# Patient Record
Sex: Male | Born: 1965 | Race: Black or African American | Hispanic: No | Smoking: Never smoker
Health system: Southern US, Community
[De-identification: ages and names within clinical notes are randomized; demographics above are authoritative.]

## PROBLEM LIST (undated history)

## (undated) DIAGNOSIS — I10 Essential (primary) hypertension: Secondary | ICD-10-CM

---

## 2006-06-30 ENCOUNTER — Emergency Department (HOSPITAL_COMMUNITY): Admission: EM | Admit: 2006-06-30 | Discharge: 2006-06-30 | Payer: Self-pay | Admitting: Emergency Medicine

## 2010-03-18 ENCOUNTER — Emergency Department (HOSPITAL_COMMUNITY): Admission: EM | Admit: 2010-03-18 | Discharge: 2010-03-18 | Payer: Self-pay | Admitting: Emergency Medicine

## 2010-05-13 ENCOUNTER — Emergency Department (HOSPITAL_COMMUNITY)
Admission: EM | Admit: 2010-05-13 | Discharge: 2010-05-13 | Payer: Self-pay | Source: Home / Self Care | Admitting: Emergency Medicine

## 2014-08-14 ENCOUNTER — Emergency Department (HOSPITAL_COMMUNITY)
Admission: EM | Admit: 2014-08-14 | Discharge: 2014-08-14 | Disposition: A | Discharge status: Home / Self Care | Payer: No Typology Code available for payment source | Attending: Emergency Medicine | Admitting: Emergency Medicine

## 2014-08-14 ENCOUNTER — Encounter (HOSPITAL_COMMUNITY): Payer: Self-pay | Admitting: Emergency Medicine

## 2014-08-14 ENCOUNTER — Emergency Department (HOSPITAL_COMMUNITY): Payer: No Typology Code available for payment source

## 2014-08-14 DIAGNOSIS — S199XXA Unspecified injury of neck, initial encounter: Secondary | ICD-10-CM | POA: Diagnosis present

## 2014-08-14 DIAGNOSIS — Y939 Activity, unspecified: Secondary | ICD-10-CM | POA: Insufficient documentation

## 2014-08-14 DIAGNOSIS — Y92410 Unspecified street and highway as the place of occurrence of the external cause: Secondary | ICD-10-CM | POA: Diagnosis not present

## 2014-08-14 DIAGNOSIS — S161XXA Strain of muscle, fascia and tendon at neck level, initial encounter: Secondary | ICD-10-CM

## 2014-08-14 DIAGNOSIS — S3992XA Unspecified injury of lower back, initial encounter: Secondary | ICD-10-CM | POA: Diagnosis not present

## 2014-08-14 DIAGNOSIS — Y998 Other external cause status: Secondary | ICD-10-CM | POA: Diagnosis not present

## 2014-08-14 DIAGNOSIS — M791 Myalgia, unspecified site: Secondary | ICD-10-CM

## 2014-08-14 DIAGNOSIS — M545 Low back pain, unspecified: Secondary | ICD-10-CM

## 2014-08-14 MED ORDER — CYCLOBENZAPRINE HCL 10 MG PO TABS: 5.0000 mg | ORAL_TABLET | Freq: Two times a day (BID) | ORAL | Status: DC | PRN
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
## 2014-08-14 NOTE — Discharge Instructions (Signed)
Please call your doctor for a followup appointment within 24-48 hours. When you talk to your doctor please let them know that you were seen in the emergency department and have them acquire all of your records so that they can discuss the findings with you and formulate a treatment plan to fully care for your new and ongoing problems. Please follow-up with health and wellness Center Please follow-up with orthopedics Please rest and stay hydrated Please apply warm compressions and massage Please follow-up with health and wellness center-please have blood pressure rechecked within the next 48 hours While on Flexeril, which is a muscle relaxer, please no drink alcohol, drive, operate any heavy machinery for this can cause drowsiness. Please continue to monitor symptoms closely and if symptoms are to worsen or change (fever greater than 101, chills, sweating, nausea, vomiting, chest pain, shortness of breathe, difficulty breathing, weakness, numbness, tingling, worsening or changes to pain pattern, fall, injury, dizziness, loss of sensation, inability to control urine or bowel movements) please report back to the Emergency Department immediately.   Muscle Pain Muscle pain (myalgia) may be caused by many things, including:  Overuse or muscle strain, especially if you are not in shape. This is the most common cause of muscle pain.  Injury.  Bruises.  Viruses, such as the flu.  Infectious diseases.  Fibromyalgia, which is a chronic condition that causes muscle tenderness, fatigue, and headache.  Autoimmune diseases, including lupus.  Certain drugs, including ACE inhibitors and statins. Muscle pain may be mild or severe. In most cases, the pain lasts only a short time and goes away without treatment. To diagnose the cause of your muscle pain, your health care provider will take your medical history. This means he or she will ask you when your muscle pain began and what has been happening. If you  have not had muscle pain for very long, your health care provider may want to wait before doing much testing. If your muscle pain has lasted a long time, your health care provider may want to run tests right away. If your health care provider thinks your muscle pain may be caused by illness, you may need to have additional tests to rule out certain conditions.  Treatment for muscle pain depends on the cause. Home care is often enough to relieve muscle pain. Your health care provider may also prescribe anti-inflammatory medicine. HOME CARE INSTRUCTIONS Watch your condition for any changes. The following actions may help to lessen any discomfort you are feeling:  Only take over-the-counter or prescription medicines as directed by your health care provider.  Apply ice to the sore muscle:  Put ice in a plastic bag.  Place a towel between your skin and the bag.  Leave the ice on for 15-20 minutes, 3-4 times a day.  You may alternate applying hot and cold packs to the muscle as directed by your health care provider.  If overuse is causing your muscle pain, slow down your activities until the pain goes away.  Remember that it is normal to feel some muscle pain after starting a workout program. Muscles that have not been used often will be sore at first.  Do regular, gentle exercises if you are not usually active.  Warm up before exercising to lower your risk of muscle pain.  Do not continue working out if the pain is very bad. Bad pain could mean you have injured a muscle. SEEK MEDICAL CARE IF:  Your muscle pain gets worse, and medicines do not  help.  You have muscle pain that lasts longer than 3 days.  You have a rash or fever along with muscle pain.  You have muscle pain after a tick bite.  You have muscle pain while working out, even though you are in good physical condition.  You have redness, soreness, or swelling along with muscle pain.  You have muscle pain after starting a  new medicine or changing the dose of a medicine. SEEK IMMEDIATE MEDICAL CARE IF:  You have trouble breathing.  You have trouble swallowing.  You have muscle pain along with a stiff neck, fever, and vomiting.  You have severe muscle weakness or cannot move part of your body. MAKE SURE YOU:   Understand these instructions.  Will watch your condition.  Will get help right away if you are not doing well or get worse. Document Released: 04/24/2006 Document Revised: 06/07/2013 Document Reviewed: 03/29/2013 [facility] Patient Information 2015 [place], [place]. This information is not intended to replace advice given to you by your health care provider. Make sure you discuss any questions you have with your health care provider.   Emergency Department Resource Guide 1) Find a Doctor and Pay Out of Pocket Although you won't have to find out who is covered by your insurance plan, it is a good idea to ask around and get recommendations. You will then need to call the office and see if the doctor you have chosen will accept you as a new patient and what types of options they offer for patients who are self-pay. Some doctors offer discounts or will set up payment plans for their patients who do not have insurance, but you will need to ask so you aren't surprised when you get to your appointment.  2) Contact Your Local Health Department Not all health departments have doctors that can see patients for sick visits, but many do, so it is worth a call to see if yours does. If you don't know where your local health department is, you can check in your phone book. The CDC also has a tool to help you locate your state's health department, and many state websites also have listings of all of their local health departments.  3) Find a Walk-in Clinic If your illness is not likely to be very severe or complicated, you may want to try a walk in clinic. These are popping up all over the country in pharmacies,  drugstores, and shopping centers. They're usually staffed by nurse practitioners or physician assistants that have been trained to treat common illnesses and complaints. They're usually fairly quick and inexpensive. However, if you have serious medical issues or chronic medical problems, these are probably not your best option.  No Primary Care Doctor: - Call Health Connect at  [phone] - they can help you locate a primary care doctor that  accepts your insurance, provides certain services, etc. - Physician Referral Service- [phone]  Chronic Pain Problems: Organization         Address  Phone   Notes  [facility]  [phone] Patients need to be referred by their primary care doctor.   Medication Assistance: Organization         Address  Phone   Notes  [facility] [address] [place] [place], [place] [address] [phone] --Must be a resident of [facility] -- Must have NO insurance coverage whatsoever (no Medicaid/ Medicare, etc.) -- The pt. MUST have a primary care doctor  that directs their care regularly and follows them in the community   [facility]  [phone]   [employer]  [phone]    Agencies that provide inexpensive medical care: Organization         Address  Phone   Notes  [facility]  [phone]   [facility]    [phone]   [employer] [address] [place], [place] [address] [phone]   [facility] [address] [place] [place] [phone]   Planned Parenthood    [phone]   [facility]    [phone]   [facility]  [address], [place] Phone:  [phone], Fax:  [phone] Hours of Operation:  9 am - 6 pm, M-F.  Also accepts Medicaid/Medicare and self-pay.  [facility]  [address], [place] Phone:  [phone], Fax: [phone]. Hours of Operation:  8:30 am - 5:30 pm, M-F.  Also accepts Medicaid and self-pay.  [facility] [place] [address], [place] Phone: [phone]   [facility] [address] Natasha Bence [place], [place] [phone], Ext. [phone] Mondays & Thursdays: 7-9 AM.  First 15 patients are seen on a first come, first serve basis.    Medicaid-accepting [facility] Providers:  Organization         Address  Phone   Notes  [facility] [address], [place] [phone] Also accepts self-pay patients.  [facility] [address] Laurell Josephs [place], [place]  [phone]   [facility] [address], [place] [phone]   [facility] [address], [place] [phone]   Renaye Rakers [address], [place]   [phone] Only accepts [place] patients after they have their name applied to their card.   Self-Pay (no insurance) in [facility]:  Organization         Address  Phone   Notes  Sickle Cell Patients, [facility] [address] [place], [place] [phone]   [facility] [address] [place], [place] [phone]   [facility] [place]  [address], [place] [phone]   [facility]/Dr. Osei-Bonsu  [address], [place] or [address], [place] [phone] Phone number for both [place] and [place] locations is the same.  [facility] [address] [place] [phone]   [facility] [address] [place] or [address] [phone]   [facility] [address] [place] [phone], phone; [phone], fax Sees patients 1st and 3rd Saturday of every month.  Must not qualify for public  or private insurance (i.e. Medicaid, Medicare, [place] Health Choice, Veterans' Benefits)  Household income should be no more than 200% of the poverty level The clinic cannot treat you if you are pregnant or think you are pregnant  Sexually transmitted diseases are not treated at the clinic.    Dental Care: Organization         Address  Phone  Notes  [facility] Department of [facility] [address] [place], [place] [phone] Accepts children  up to age 35 who are enrolled in Medicaid or [place] Health Choice; pregnant women with a Medicaid card; and children who have applied for Medicaid or [place] Health Choice, but were declined, whose parents can pay a reduced fee at time of service.  [facility]  [address], [place] [phone] Accepts children up to age 25 who are enrolled in [place] or [place] Health Choice; pregnant women with a Medicaid card; and children who have applied for Medicaid or [place] Health Choice, but were declined, whose parents can pay a reduced fee at time of service.  [facility]  [address] [place], [place] [phone] Patients are seen by appointment only. Walk-ins are not accepted. [facility] will see patients 32 years of age and older. Monday - Tuesday (8am-5pm) Most Wednesdays (8:30-5pm) $30 per visit, cash only  [facility] Adult Dental Access PROGRAM  [address], [facility] [phone] Patients are seen by appointment only. Walk-ins are not accepted. [facility] will see patients 53 years of age and older. One Wednesday Evening (Monthly: Volunteer Based).  $30 per visit, cash only  [employer]  [phone] for adults; Children under age 70, call Graduate Pediatric Dentistry at [phone]. Children aged 57-14, please call [phone] to request a pediatric application.  Dental services are provided in all areas of  dental care including fillings, crowns and bridges, complete and partial dentures, implants, gum treatment, root canals, and extractions. Preventive care is also provided. Treatment is provided to both adults and children. Patients are selected via a lottery and there is often a waiting list.   [facility] [address], [place]  [phone] [email or web address]   [facility] [address] [place], [place] [phone], Ext. [phone] Second and Fourth Thursday of each month, opens at 6:30 AM; Clinic ends at 9 AM.  Patients are seen on a first-come first-served basis, and a limited number are seen during each clinic.   [facility]  [address] Ether Griffins [place], [place] [phone]   Eligibility Requirements You must have lived in [place], [place], or [place] counties for at least the last three months.   You cannot be eligible for state or federal sponsored [place], including [employer], [place], or [employer].   You generally cannot be eligible for healthcare insurance through your employer.    How to apply: Eligibility screenings are held every Tuesday and Wednesday afternoon from 1:00 pm until 4:00 pm. You do not need an appointment for the interview!  [facility] [address] [place], [place] [phone]   [facility] Health Department  [phone]   [facility] Health Department  [phone]   [facility] Health Department  [phone]    Behavioral Health Resources in the Community: Intensive Outpatient Programs Organization         Address  Phone  Notes  [facility] [address], [place], [place] [phone]   [facility] Outpatient [address] [place], [place] [phone]   [facility] [address] [place], [place]  [phone]   [facility] Mental [facility] [address],  [place], [place] [phone] or  [phone]   Substance Abuse Resources Organization         Address  Phone  Notes  [facility] Drug Services  [phone]   Addiction  [facility]  [phone]   [facility]  [phone]   [facility]  [phone]   Residential & Outpatient Substance Abuse Program  [phone]   Psychological Services Organization         Address  Phone  Notes  [facility]  [phone]- [phone]   [facility]  [phone]- [phone]   [facility] [address], [place] [phone] or [phone]    Mobile Crisis Teams Organization         Address  Phone  Notes  [facility], Mobile Crisis Care Unit  [phone]   [facility]  [address] [place], [place] [phone]   Doristine LocksSharon DeEsch [address] [place] [phone]    Self-Help/Support Groups Organization         Address  Phone             Notes  [facility] [place] - variety of support groups  336- [phone] Call for more information  Narcotics Anonymous (NA), [facility] [address], [place]  2 meetings at this location   Statisticianesidential Treatment Programs Organization         Address  Phone  Notes  [facility] [address],    [place]  [phone]   [facility]  [address], [place] [phone], [place], [place] [phone]   [facility] [address], [place] [phone] Admissions: 8am-3pm M-F  [facility] [address]    [place], [place] [phone]   [facility] [address], [place], [place] [phone]   [facility] [address]  [place], [place] [phone]   [facility] - Intensive Outpatient [address], [place], [place] [phone]   [facility] [address]  [place], [place] [phone] or [phone]   Residential  Treatment Services (RTS) [address] [place], [place] [phone] Accepts Medicaid  [facility] [address]  [place] [phone] Substance Abuse/Addiction Treatment   [facility] Behavioral Health Resources Organization         Address  Phone  Notes  [facility]  [phone]) [phone]   Angie FavaJulie Brannon, PhD [address] [place], [place]   [phone] or [phone]) [phone]   [facility]   [address] [place], [place] [phone](336) [phone]   [facility] [address], [place], [place] [phone] Insurance/Medicaid/sponsorship through [facility]  [facility] [address]                                    [place], [place] [phone] Therapy/tele-psych/case  [facility] [address]   [place], [place] [phone]    Dr. Lolly MustacheArfeen  [phone](336) [phone]   [facility]  [employer] [facility] 1) [address], [place] 2) [address], [place] 3)  [address] [place] [address], [place] [phone](336) [phone]  [phone]   [facility] Child Abuse Hotline [phone](336) [phone] or [phone] (After Hours)
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
## 2014-08-16 ENCOUNTER — Encounter (HOSPITAL_COMMUNITY): Payer: Self-pay | Admitting: *Deleted

## 2014-08-16 ENCOUNTER — Emergency Department (HOSPITAL_COMMUNITY)
Admission: EM | Admit: 2014-08-16 | Discharge: 2014-08-16 | Disposition: A | Discharge status: Home / Self Care | Payer: Self-pay | Attending: Emergency Medicine | Admitting: Emergency Medicine

## 2014-08-16 DIAGNOSIS — M542 Cervicalgia: Secondary | ICD-10-CM | POA: Insufficient documentation

## 2014-08-16 DIAGNOSIS — G8911 Acute pain due to trauma: Secondary | ICD-10-CM | POA: Insufficient documentation

## 2014-08-16 DIAGNOSIS — R202 Paresthesia of skin: Secondary | ICD-10-CM | POA: Insufficient documentation

## 2014-08-16 DIAGNOSIS — R200 Anesthesia of skin: Secondary | ICD-10-CM | POA: Insufficient documentation

## 2014-08-16 DIAGNOSIS — M5441 Lumbago with sciatica, right side: Secondary | ICD-10-CM | POA: Insufficient documentation

## 2014-08-16 MED ORDER — IBUPROFEN 400 MG PO TABS
800.0000 mg | ORAL_TABLET | Freq: Once | ORAL | Status: AC
  Administered 2014-08-16: 800 mg via ORAL
  Filled 2014-08-16: qty 2

## 2014-08-16 MED ORDER — HYDROCODONE-ACETAMINOPHEN 5-325 MG PO TABS: 1.0000 | ORAL_TABLET | ORAL | Status: DC | PRN
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
## 2014-08-16 NOTE — Discharge Instructions (Signed)
Return to the emergency room with worsening of symptoms, new symptoms or with symptoms that are concerning , especially fevers, loss of control of bladder or bowels, numbness or tingling around genital region or anus, weakness. RICE: Rest, Ice (three cycles of 20 mins on, 20mins off at least twice a day), compression/brace, elevation. Heating pad works well for back pain. Ibuprofen 400mg  (2 tablets 200mg ) every 5-6 hours for 3-5 days. Norco for severe pain. Do not operate machinery, drive or drink alcohol while taking narcotics or muscle relaxers. Do not take Norco and Flexeril within 4 hours of each other. Call to make appointment today for follow-up with orthopedist. Read below information and follow recommendations.   Back Exercises Back exercises help treat and prevent back injuries. The goal of back exercises is to increase the strength of your abdominal and back muscles and the flexibility of your back. These exercises should be started when you no longer have back pain. Back exercises include:  Pelvic Tilt. Lie on your back with your knees bent. Tilt your pelvis until the lower part of your back is against the floor. Hold this position 5 to 10 sec and repeat 5 to 10 times.  Knee to Chest. Pull first 1 knee up against your chest and hold for 20 to 30 seconds, repeat this with the other knee, and then both knees. This may be done with the other leg straight or bent, whichever feels better.  Sit-Ups or Curl-Ups. Bend your knees 90 degrees. Start with tilting your pelvis, and do a partial, slow sit-up, lifting your trunk only 30 to 45 degrees off the floor. Take at least 2 to 3 seconds for each sit-up. Do not do sit-ups with your knees out straight. If partial sit-ups are difficult, simply do the above but with only tightening your abdominal muscles and holding it as directed.  Hip-Lift. Lie on your back with your knees flexed 90 degrees. Push down with your feet and shoulders as you raise your  hips a couple inches off the floor; hold for 10 seconds, repeat 5 to 10 times.  Back arches. Lie on your stomach, propping yourself up on bent elbows. Slowly press on your hands, causing an arch in your low back. Repeat 3 to 5 times. Any initial stiffness and discomfort should lessen with repetition over time.  Shoulder-Lifts. Lie face down with arms beside your body. Keep hips and torso pressed to floor as you slowly lift your head and shoulders off the floor. Do not overdo your exercises, especially in the beginning. Exercises may cause you some mild back discomfort which lasts for a few minutes; however, if the pain is more severe, or lasts for more than 15 minutes, do not continue exercises until you see your caregiver. Improvement with exercise therapy for back problems is slow.  See your caregivers for assistance with developing a proper back exercise program. Document Released: 07/10/2004 Document Revised: 08/25/2011 Document Reviewed: 04/03/2011 [facility] Patient Information 2015 [employer] [place] [employer], [place]. This information is not intended to replace advice given to you by your health care provider. Make sure you discuss any questions you have with your health care provider.

## 2014-08-30 ENCOUNTER — Other Ambulatory Visit (HOSPITAL_COMMUNITY): Payer: Self-pay | Admitting: Orthopaedic Surgery

## 2014-08-30 DIAGNOSIS — M545 Low back pain: Secondary | ICD-10-CM

## 2014-09-01 ENCOUNTER — Ambulatory Visit (HOSPITAL_COMMUNITY)
Admission: RE | Admit: 2014-09-01 | Discharge: 2014-09-01 | Disposition: A | Discharge status: Home / Self Care | Payer: No Typology Code available for payment source | Source: Ambulatory Visit | Attending: Orthopaedic Surgery | Admitting: Orthopaedic Surgery

## 2014-09-01 DIAGNOSIS — M545 Low back pain: Secondary | ICD-10-CM

## 2014-09-01 DIAGNOSIS — M5136 Other intervertebral disc degeneration, lumbar region: Secondary | ICD-10-CM | POA: Diagnosis not present

## 2014-09-01 DIAGNOSIS — R200 Anesthesia of skin: Secondary | ICD-10-CM | POA: Diagnosis present

## 2014-09-01 DIAGNOSIS — E882 Lipomatosis, not elsewhere classified: Secondary | ICD-10-CM | POA: Diagnosis not present
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
## 2020-10-08 ENCOUNTER — Ambulatory Visit
Admission: EM | Admit: 2020-10-08 | Discharge: 2020-10-08 | Disposition: A | Discharge status: Home / Self Care | Payer: Self-pay

## 2020-10-08 ENCOUNTER — Other Ambulatory Visit: Payer: Self-pay

## 2020-10-08 DIAGNOSIS — J019 Acute sinusitis, unspecified: Secondary | ICD-10-CM

## 2020-10-08 MED ORDER — BENZONATATE 200 MG PO CAPS: 200.0000 mg | ORAL_CAPSULE | Freq: Three times a day (TID) | ORAL | 0 refills | Status: AC | PRN

## 2020-10-08 MED ORDER — DM-GUAIFENESIN ER 30-600 MG PO TB12: 1.0000 | ORAL_TABLET | Freq: Two times a day (BID) | ORAL | 0 refills | Status: AC

## 2020-10-08 MED ORDER — AMOXICILLIN-POT CLAVULANATE 875-125 MG PO TABS: 1.0000 | ORAL_TABLET | Freq: Two times a day (BID) | ORAL | 0 refills | Status: AC

## 2020-10-08 NOTE — Discharge Instructions (Signed)
Augmentin twice daily x1 week Mucinex DM twice daily Tessalon/benzonatate every 8 hours for cough May also use over-the-counter cetirizine/Zyrtec or loratadine/Claritin to further help with congestion and postnasal drainage/throat irritation Rest and fluids Follow-up if not improving or worsening
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
## 2020-10-09 NOTE — ED Provider Notes (Signed)
[name]    CSN: [id number] Arrival date & time: 10/08/20  1833      History   Chief Complaint Chief Complaint  Patient presents with  . Insect Bite  . SINUSES    HPI Gregory Gill is a 55 y.o. male presenting today for evaluation of URI symptoms.  Reports associated sinus congestion and pressure for the past 2 to 3 weeks.  Reports symptoms started after spider bite.  Reports initially had some irritation and inflammation to his right forearm, but this is fully resolved.  Denies any further concerns regarding spider bite, main concern is sinus symptoms.  Denies fevers.  Using OTC meds without relief.  Reports mucus is very thick and discolored.  HPI  History reviewed. No pertinent past medical history.  There are no problems to display for this patient.   History reviewed. No pertinent surgical history.     Home Medications    Prior to Admission medications   Medication Sig Start Date End Date Taking? Authorizing Provider  amoxicillin-clavulanate (AUGMENTIN) 875-125 MG tablet Take 1 tablet by mouth every 12 (twelve) hours for 7 days. 10/08/20 10/15/20 Yes Armoni Kludt C, PA-C  benzonatate (TESSALON) 200 MG capsule Take 1 capsule (200 mg total) by mouth 3 (three) times daily as needed for up to 7 days for cough. 10/08/20 10/15/20 Yes Walton Digilio C, PA-C  dextromethorphan-guaiFENesin (MUCINEX DM) 30-600 MG 12hr tablet Take 1 tablet by mouth 2 (two) times daily. 10/08/20  Yes Ripley Lovecchio, Junius Creamer, PA-C    Family History Family History  Problem Relation Age of Onset  . Hypertension Mother   . Heart Problems Mother     Social History Social History   Tobacco Use  . Smoking status: Never Smoker  . Smokeless tobacco: Never Used  Substance Use Topics  . Alcohol use: Yes  . Drug use: No     Allergies   Patient has no known allergies.   Review of Systems Review of Systems  Constitutional: Negative for activity change, appetite change, chills,  fatigue and fever.  HENT: Positive for congestion, rhinorrhea, sinus pressure and sore throat. Negative for ear pain and trouble swallowing.   Eyes: Negative for discharge and redness.  Respiratory: Positive for cough. Negative for chest tightness and shortness of breath.   Cardiovascular: Negative for chest pain.  Gastrointestinal: Negative for abdominal pain, diarrhea, nausea and vomiting.  Musculoskeletal: Negative for myalgias.  Skin: Negative for rash.  Neurological: Negative for dizziness, light-headedness and headaches.     Physical Exam Triage Vital Signs ED Triage Vitals  Enc Vitals Group     BP 10/08/20 1958 (!) 162/87     Pulse Rate 10/08/20 1958 93     Resp 10/08/20 1958 18     Temp 10/08/20 1958 99.4 F (37.4 C)     Temp Source 10/08/20 1958 Oral     SpO2 10/08/20 1958 97 %     Weight --      Height --      Head Circumference --      Peak Flow --      Pain Score 10/08/20 1956 0     Pain Loc --      Pain Edu? --      Excl. in GC? --    No data found.  Updated Vital Signs BP (!) 162/87 (BP Location: Left Arm)   Pulse 93   Temp 99.4 F (37.4 C) (Oral)   Resp 18   SpO2 97%  Visual Acuity Right Eye Distance:   Left Eye Distance:   Bilateral Distance:    Right Eye Near:   Left Eye Near:    Bilateral Near:     Physical Exam Vitals and nursing note reviewed.  Constitutional:      Appearance: He is well-developed.     Comments: No acute distress  HENT:     Head: Normocephalic and atraumatic.     Ears:     Comments: Bilateral ears without tenderness to palpation of external auricle, tragus and mastoid, EAC's without erythema or swelling, TM's with good bony landmarks and cone of light. Non erythematous.     Nose: Nose normal.     Mouth/Throat:     Comments: Oral mucosa pink and moist, no tonsillar enlargement or exudate. Posterior pharynx patent and nonerythematous, no uvula deviation or swelling. Normal phonation. Eyes:     Conjunctiva/sclera:  Conjunctivae normal.  Cardiovascular:     Rate and Rhythm: Normal rate.  Pulmonary:     Effort: Pulmonary effort is normal. No respiratory distress.     Comments: Breathing comfortably at rest, CTABL, no wheezing, rales or other adventitious sounds auscultated Abdominal:     General: There is no distension.  Musculoskeletal:        General: Normal range of motion.     Cervical back: Neck supple.  Skin:    General: Skin is warm and dry.  Neurological:     Mental Status: He is alert and oriented to person, place, and time.      UC Treatments / Results  Labs (all labs ordered are listed, but only abnormal results are displayed) Labs Reviewed - No data to display  EKG   Radiology No results found.  Procedures Procedures (including critical care time)  Medications Ordered in UC Medications - No data to display  Initial Impression / Assessment and Plan / UC Course  I have reviewed the triage vital signs and the nursing notes.  Pertinent labs & imaging results that were available during my care of the patient were reviewed by me and considered in my medical decision making (see chart for details).     URI symptoms x3 weeks-treating for sinusitis with Augmentin, continue symptomatic and supportive care of cough congestion as well.  Rest and fluids.  Discussed strict return precautions. Patient verbalized understanding and is agreeable with plan.  Final Clinical Impressions(s) / UC Diagnoses   Final diagnoses:  Acute sinusitis with symptoms > 10 days     Discharge Instructions     Augmentin twice daily x1 week Mucinex DM twice daily Tessalon/benzonatate every 8 hours for cough May also use over-the-counter cetirizine/Zyrtec or loratadine/Claritin to further help with congestion and postnasal drainage/throat irritation Rest and fluids Follow-up if not improving or worsening   ED Prescriptions    Medication Sig Dispense Auth. Provider   amoxicillin-clavulanate  (AUGMENTIN) 875-125 MG tablet Take 1 tablet by mouth every 12 (twelve) hours for 7 days. 14 tablet Annika Selke C, PA-C   dextromethorphan-guaiFENesin (MUCINEX DM) 30-600 MG 12hr tablet Take 1 tablet by mouth 2 (two) times daily. 20 tablet Aiman Noe C, PA-C   benzonatate (TESSALON) 200 MG capsule Take 1 capsule (200 mg total) by mouth 3 (three) times daily as needed for up to 7 days for cough. 28 capsule Maleiah Dula, Naches C, PA-C     PDMP not reviewed this encounter.   Lew Dawes, [place] 10/09/20 [phone]

## 2021-07-31 ENCOUNTER — Encounter (HOSPITAL_BASED_OUTPATIENT_CLINIC_OR_DEPARTMENT_OTHER): Payer: Self-pay | Admitting: Emergency Medicine

## 2021-07-31 ENCOUNTER — Ambulatory Visit
Admission: EM | Admit: 2021-07-31 | Discharge: 2021-07-31 | Disposition: A | Discharge status: Home / Self Care | Payer: Self-pay

## 2021-07-31 ENCOUNTER — Emergency Department (HOSPITAL_BASED_OUTPATIENT_CLINIC_OR_DEPARTMENT_OTHER)
Admission: EM | Admit: 2021-07-31 | Discharge: 2021-07-31 | Disposition: A | Discharge status: Home / Self Care | Payer: No Typology Code available for payment source | Attending: Emergency Medicine | Admitting: Emergency Medicine

## 2021-07-31 ENCOUNTER — Other Ambulatory Visit: Payer: Self-pay

## 2021-07-31 ENCOUNTER — Emergency Department (HOSPITAL_BASED_OUTPATIENT_CLINIC_OR_DEPARTMENT_OTHER): Payer: No Typology Code available for payment source | Admitting: Radiology

## 2021-07-31 DIAGNOSIS — M545 Low back pain, unspecified: Secondary | ICD-10-CM

## 2021-07-31 DIAGNOSIS — I10 Essential (primary) hypertension: Secondary | ICD-10-CM | POA: Diagnosis not present

## 2021-07-31 DIAGNOSIS — M549 Dorsalgia, unspecified: Secondary | ICD-10-CM | POA: Diagnosis not present

## 2021-07-31 DIAGNOSIS — S161XXA Strain of muscle, fascia and tendon at neck level, initial encounter: Secondary | ICD-10-CM

## 2021-07-31 DIAGNOSIS — S39012A Strain of muscle, fascia and tendon of lower back, initial encounter: Secondary | ICD-10-CM

## 2021-07-31 DIAGNOSIS — Z79899 Other long term (current) drug therapy: Secondary | ICD-10-CM | POA: Diagnosis not present

## 2021-07-31 DIAGNOSIS — S199XXA Unspecified injury of neck, initial encounter: Secondary | ICD-10-CM | POA: Diagnosis present

## 2021-07-31 DIAGNOSIS — I161 Hypertensive emergency: Secondary | ICD-10-CM

## 2021-07-31 DIAGNOSIS — Y92410 Unspecified street and highway as the place of occurrence of the external cause: Secondary | ICD-10-CM | POA: Diagnosis not present

## 2021-07-31 DIAGNOSIS — M542 Cervicalgia: Secondary | ICD-10-CM

## 2021-07-31 HISTORY — DX: Essential (primary) hypertension: I10

## 2021-07-31 LAB — CBC WITH DIFFERENTIAL/PLATELET
Abs Immature Granulocytes: 0.01 10*3/uL (ref 0.00–0.07)
Basophils Absolute: 0.1 10*3/uL (ref 0.0–0.1)
Basophils Relative: 1 %
Eosinophils Absolute: 0.1 10*3/uL (ref 0.0–0.5)
Eosinophils Relative: 2 %
HCT: 38.8 % — ABNORMAL LOW (ref 39.0–52.0)
Hemoglobin: 13.3 g/dL (ref 13.0–17.0)
Immature Granulocytes: 0 %
Lymphocytes Relative: 18 %
Lymphs Abs: 1.2 10*3/uL (ref 0.7–4.0)
MCH: 30.3 pg (ref 26.0–34.0)
MCHC: 34.3 g/dL (ref 30.0–36.0)
MCV: 88.4 fL (ref 80.0–100.0)
Monocytes Absolute: 0.5 10*3/uL (ref 0.1–1.0)
Monocytes Relative: 8 %
Neutro Abs: 4.9 10*3/uL (ref 1.7–7.7)
Neutrophils Relative %: 71 %
Platelets: 230 10*3/uL (ref 150–400)
RBC: 4.39 MIL/uL (ref 4.22–5.81)
RDW: 12.2 % (ref 11.5–15.5)
WBC: 6.8 10*3/uL (ref 4.0–10.5)
nRBC: 0 % (ref 0.0–0.2)

## 2021-07-31 LAB — BASIC METABOLIC PANEL
Anion gap: 11 (ref 5–15)
BUN: 14 mg/dL (ref 6–20)
CO2: 25 mmol/L (ref 22–32)
Calcium: 9.3 mg/dL (ref 8.9–10.3)
Chloride: 101 mmol/L (ref 98–111)
Creatinine, Ser: 0.89 mg/dL (ref 0.61–1.24)
GFR, Estimated: >60 mL/min (ref >60)
Glucose, Bld: 92 mg/dL (ref 70–99)
Potassium: 3.6 mmol/L (ref 3.5–5.1)
Sodium: 137 mmol/L (ref 135–145)

## 2021-07-31 MED ORDER — METHOCARBAMOL 500 MG PO TABS
500.0000 mg | ORAL_TABLET | Freq: Once | ORAL | Status: AC
  Administered 2021-07-31: 500 mg via ORAL
  Filled 2021-07-31: qty 1

## 2021-07-31 MED ORDER — AMLODIPINE BESYLATE 10 MG PO TABS: 10.0000 mg | ORAL_TABLET | Freq: Every day | ORAL | 0 refills | Status: AC

## 2021-07-31 MED ORDER — AMLODIPINE BESYLATE 5 MG PO TABS
10.0000 mg | ORAL_TABLET | Freq: Once | ORAL | Status: AC
  Administered 2021-07-31: 10 mg via ORAL
  Filled 2021-07-31: qty 2

## 2021-07-31 MED ORDER — NAPROXEN 500 MG PO TABS: 500.0000 mg | ORAL_TABLET | Freq: Two times a day (BID) | ORAL | 0 refills | Status: AC | PRN

## 2021-07-31 MED ORDER — METHOCARBAMOL 500 MG PO TABS: 500.0000 mg | ORAL_TABLET | Freq: Two times a day (BID) | ORAL | 0 refills | Status: AC

## 2021-07-31 MED ORDER — NAPROXEN 250 MG PO TABS
500.0000 mg | ORAL_TABLET | Freq: Once | ORAL | Status: AC
  Administered 2021-07-31: 500 mg via ORAL
  Filled 2021-07-31: qty 2
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
## 2021-07-31 NOTE — Discharge Instructions (Signed)
Please go to the emergency department as soon as you leave urgent care for further evaluation and management. ?

## 2021-07-31 NOTE — ED Notes (Signed)
Patient transported to x-ray via wheelchair with tech  °
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
## 2023-01-08 ENCOUNTER — Ambulatory Visit (HOSPITAL_COMMUNITY)
Admission: EM | Admit: 2023-01-08 | Discharge: 2023-01-08 | Disposition: A | Discharge status: Home / Self Care | Payer: Self-pay

## 2023-01-08 ENCOUNTER — Other Ambulatory Visit: Payer: Self-pay

## 2023-01-08 ENCOUNTER — Emergency Department (HOSPITAL_COMMUNITY)
Admission: EM | Admit: 2023-01-08 | Discharge: 2023-01-09 | Disposition: A | Discharge status: Home / Self Care | Payer: Self-pay | Attending: Emergency Medicine | Admitting: Emergency Medicine

## 2023-01-08 ENCOUNTER — Encounter (HOSPITAL_COMMUNITY): Payer: Self-pay

## 2023-01-08 DIAGNOSIS — Y92007 Garden or yard of unspecified non-institutional (private) residence as the place of occurrence of the external cause: Secondary | ICD-10-CM | POA: Insufficient documentation

## 2023-01-08 DIAGNOSIS — Z23 Encounter for immunization: Secondary | ICD-10-CM | POA: Insufficient documentation

## 2023-01-08 DIAGNOSIS — S61012A Laceration without foreign body of left thumb without damage to nail, initial encounter: Secondary | ICD-10-CM | POA: Insufficient documentation

## 2023-01-08 DIAGNOSIS — W260XXA Contact with knife, initial encounter: Secondary | ICD-10-CM | POA: Insufficient documentation

## 2023-01-08 MED ORDER — TETANUS-DIPHTH-ACELL PERTUSSIS 5-2.5-18.5 LF-MCG/0.5 IM SUSY
0.5000 mL | PREFILLED_SYRINGE | Freq: Once | INTRAMUSCULAR | Status: AC
  Administered 2023-01-09: 0.5 mL via INTRAMUSCULAR
  Filled 2023-01-08: qty 0.5

## 2023-01-08 MED ORDER — SILVER NITRATE-POT NITRATE 75-25 % EX MISC
1.0000 | Freq: Once | CUTANEOUS | Status: AC
  Administered 2023-01-09: 1 via TOPICAL
  Filled 2023-01-08: qty 1
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
## 2023-01-08 NOTE — ED Triage Notes (Signed)
Pt cut the tip of his left pointer finger with a knife. Finger was wrapped and pt was evaluated by provider. Pt sent to ED for evaluation.

## 2023-01-08 NOTE — ED Notes (Signed)
Patient called for triage w/ no answer. Dragging OTF.

## 2023-01-08 NOTE — Discharge Instructions (Signed)
Keep your thumb dry for the next 2 days, after that you can shower regularly with soap and water.  This will likely form scar tissue over the end of your left thumb.  If it does begin bleeding again, you can hold firm pressure over your thumb for 10 minutes as we discussed.  We gave you a tetanus shot here in the ED.

## 2023-01-08 NOTE — ED Notes (Signed)
Patient called for triage x2 

## 2023-01-08 NOTE — ED Triage Notes (Signed)
Was doing some yard work and caught the tip of his left thumb with a butchers knife.   No closable wound observed.   Sent in by [facility] for further evaluation.

## 2023-01-08 NOTE — ED Provider Notes (Signed)
Patient seen briefly in triage.  About 30 minutes ago he cut himself on his left thumb, cutting off a hog of his distal left thumb and nail.  He has had a pressure bandage on it since he did this and it is    On exam the pressure bandage is removed and the area is still bleeding profusely.  Pressure bandage is reapplied and I have asked him to proceed to the emergency room.  We cannot get his bleeding to stop with the materials we have here in the clinic.   Zenia Resides, MD 01/08/23 Jerene Bears
